# Patient Record
Sex: Female | Born: 1945 | Race: White | Hispanic: No | Smoking: Current every day smoker
Health system: Southern US, Community
[De-identification: ages and names within clinical notes are randomized; demographics above are authoritative.]

## PROBLEM LIST (undated history)

## (undated) DIAGNOSIS — R296 Repeated falls: Secondary | ICD-10-CM

## (undated) DIAGNOSIS — W19XXXA Unspecified fall, initial encounter: Secondary | ICD-10-CM

## (undated) DIAGNOSIS — L932 Other local lupus erythematosus: Secondary | ICD-10-CM

## (undated) DIAGNOSIS — M3500 Sicca syndrome, unspecified: Secondary | ICD-10-CM

## (undated) DIAGNOSIS — M3320 Polymyositis, organ involvement unspecified: Secondary | ICD-10-CM

## (undated) DIAGNOSIS — L409 Psoriasis, unspecified: Secondary | ICD-10-CM

## (undated) HISTORY — PX: OVARIAN CYST REMOVAL: SHX89

## (undated) HISTORY — PX: KNEE ARTHROSCOPY: SUR90

## (undated) HISTORY — PX: ABDOMINAL HYSTERECTOMY: SHX81

## (undated) HISTORY — PX: CHOLECYSTECTOMY: SHX55

## (undated) HISTORY — PX: SHOULDER SURGERY: SHX246

## (undated) HISTORY — PX: ABDOMINAL SURGERY: SHX537

---

## 1998-08-26 ENCOUNTER — Other Ambulatory Visit: Admission: RE | Admit: 1998-08-26 | Discharge: 1998-08-26 | Payer: Self-pay | Admitting: Obstetrics and Gynecology

## 2000-02-28 ENCOUNTER — Other Ambulatory Visit: Admission: RE | Admit: 2000-02-28 | Discharge: 2000-02-28 | Payer: Self-pay | Admitting: Obstetrics and Gynecology

## 2001-07-31 ENCOUNTER — Other Ambulatory Visit: Admission: RE | Admit: 2001-07-31 | Discharge: 2001-07-31 | Payer: Self-pay | Admitting: Obstetrics and Gynecology

## 2002-05-20 ENCOUNTER — Other Ambulatory Visit: Admission: RE | Admit: 2002-05-20 | Discharge: 2002-05-20 | Payer: Self-pay | Admitting: Obstetrics and Gynecology

## 2005-10-24 ENCOUNTER — Other Ambulatory Visit: Admission: RE | Admit: 2005-10-24 | Discharge: 2005-10-24 | Payer: Self-pay | Admitting: Obstetrics and Gynecology

## 2007-02-28 ENCOUNTER — Ambulatory Visit (HOSPITAL_COMMUNITY): Admission: RE | Admit: 2007-02-28 | Discharge: 2007-02-28 | Payer: Self-pay | Admitting: Specialist

## 2008-09-07 ENCOUNTER — Ambulatory Visit: Payer: Self-pay | Admitting: Obstetrics and Gynecology

## 2008-09-07 ENCOUNTER — Encounter: Payer: Self-pay | Admitting: Obstetrics and Gynecology

## 2008-09-07 ENCOUNTER — Other Ambulatory Visit: Admission: RE | Admit: 2008-09-07 | Discharge: 2008-09-07 | Payer: Self-pay | Admitting: Obstetrics and Gynecology

## 2008-09-20 ENCOUNTER — Ambulatory Visit: Payer: Self-pay | Admitting: Obstetrics and Gynecology

## 2009-06-05 ENCOUNTER — Observation Stay (HOSPITAL_COMMUNITY): Admission: EM | Admit: 2009-06-05 | Discharge: 2009-06-06 | Payer: Self-pay | Admitting: Emergency Medicine

## 2009-06-16 ENCOUNTER — Observation Stay (HOSPITAL_COMMUNITY): Admission: RE | Admit: 2009-06-16 | Discharge: 2009-06-18 | Payer: Self-pay | Admitting: Orthopedic Surgery

## 2009-12-21 ENCOUNTER — Ambulatory Visit: Payer: Self-pay | Admitting: Obstetrics and Gynecology

## 2009-12-21 ENCOUNTER — Other Ambulatory Visit: Admission: RE | Admit: 2009-12-21 | Discharge: 2009-12-21 | Payer: Self-pay | Admitting: Obstetrics and Gynecology

## 2010-06-11 DIAGNOSIS — M3320 Polymyositis, organ involvement unspecified: Secondary | ICD-10-CM

## 2010-06-11 HISTORY — DX: Polymyositis, organ involvement unspecified: M33.20

## 2010-08-27 LAB — URINALYSIS, ROUTINE W REFLEX MICROSCOPIC
Nitrite: NEGATIVE
Protein, ur: NEGATIVE mg/dL
Specific Gravity, Urine: 1.017 (ref 1.005–1.030)
Urobilinogen, UA: 0.2 mg/dL (ref 0.0–1.0)

## 2010-08-27 LAB — CBC
HCT: 32.5 % — ABNORMAL LOW (ref 36.0–46.0)
Hemoglobin: 11.3 g/dL — ABNORMAL LOW (ref 12.0–15.0)
MCHC: 34.9 g/dL (ref 30.0–36.0)
Platelets: 287 10*3/uL (ref 150–400)
RDW: 14.1 % (ref 11.5–15.5)

## 2010-08-27 LAB — COMPREHENSIVE METABOLIC PANEL
Albumin: 3.9 g/dL (ref 3.5–5.2)
Alkaline Phosphatase: 103 U/L (ref 39–117)
BUN: 11 mg/dL (ref 6–23)
Calcium: 9 mg/dL (ref 8.4–10.5)
Glucose, Bld: 107 mg/dL — ABNORMAL HIGH (ref 70–99)
Potassium: 4.3 mEq/L (ref 3.5–5.1)
Total Protein: 6.7 g/dL (ref 6.0–8.3)

## 2010-08-27 LAB — PROTIME-INR
INR: 1.02 (ref 0.00–1.49)
Prothrombin Time: 13.3 seconds (ref 11.6–15.2)

## 2010-08-27 LAB — APTT: aPTT: 40 seconds — ABNORMAL HIGH (ref 24–37)

## 2010-08-27 LAB — URINE MICROSCOPIC-ADD ON

## 2010-09-11 LAB — COMPREHENSIVE METABOLIC PANEL
AST: 24 U/L (ref 0–37)
Albumin: 3.5 g/dL (ref 3.5–5.2)
BUN: 11 mg/dL (ref 6–23)
Calcium: 8.8 mg/dL (ref 8.4–10.5)
Chloride: 97 mEq/L (ref 96–112)
Creatinine, Ser: 0.52 mg/dL (ref 0.4–1.2)
GFR calc Af Amer: >60 mL/min (ref >60)
GFR calc non Af Amer: >60 mL/min (ref >60)
Total Bilirubin: 0.7 mg/dL (ref 0.3–1.2)

## 2010-09-11 LAB — PROTIME-INR: INR: 0.96 (ref 0.00–1.49)

## 2010-09-11 LAB — CBC
HCT: 29.1 % — ABNORMAL LOW (ref 36.0–46.0)
MCHC: 35.2 g/dL (ref 30.0–36.0)
MCV: 91.9 fL (ref 78.0–100.0)
Platelets: 224 10*3/uL (ref 150–400)
RDW: 13.3 % (ref 11.5–15.5)
WBC: 5.9 10*3/uL (ref 4.0–10.5)

## 2010-09-11 LAB — APTT: aPTT: 39 seconds — ABNORMAL HIGH (ref 24–37)

## 2013-05-31 ENCOUNTER — Emergency Department (INDEPENDENT_AMBULATORY_CARE_PROVIDER_SITE_OTHER)
Admission: EM | Admit: 2013-05-31 | Discharge: 2013-05-31 | Disposition: A | Discharge status: Home / Self Care | Payer: Medicare Other | Source: Home / Self Care | Attending: Emergency Medicine | Admitting: Emergency Medicine

## 2013-05-31 ENCOUNTER — Encounter: Payer: Self-pay | Admitting: Emergency Medicine

## 2013-05-31 DIAGNOSIS — J0100 Acute maxillary sinusitis, unspecified: Secondary | ICD-10-CM

## 2013-05-31 DIAGNOSIS — L932 Other local lupus erythematosus: Secondary | ICD-10-CM

## 2013-05-31 DIAGNOSIS — K1370 Unspecified lesions of oral mucosa: Secondary | ICD-10-CM

## 2013-05-31 DIAGNOSIS — L930 Discoid lupus erythematosus: Secondary | ICD-10-CM

## 2013-05-31 DIAGNOSIS — K121 Other forms of stomatitis: Secondary | ICD-10-CM

## 2013-05-31 HISTORY — DX: Polymyositis, organ involvement unspecified: M33.20

## 2013-05-31 HISTORY — DX: Psoriasis, unspecified: L40.9

## 2013-05-31 HISTORY — DX: Repeated falls: R29.6

## 2013-05-31 HISTORY — DX: Other local lupus erythematosus: L93.2

## 2013-05-31 HISTORY — DX: Sjogren syndrome, unspecified: M35.00

## 2013-05-31 HISTORY — DX: Unspecified fall, initial encounter: W19.XXXA

## 2013-05-31 MED ORDER — PREDNISONE (PAK) 10 MG PO TABS: ORAL_TABLET | ORAL | Status: AC

## 2013-05-31 MED ORDER — OXYCODONE-ACETAMINOPHEN 5-325 MG PO TABS: 1.0000 | ORAL_TABLET | Freq: Four times a day (QID) | ORAL | Status: AC | PRN

## 2013-05-31 MED ORDER — METHYLPREDNISOLONE ACETATE 80 MG/ML IJ SUSP
80.0000 mg | Freq: Once | INTRAMUSCULAR | Status: AC
  Administered 2013-05-31: 80 mg via INTRAMUSCULAR

## 2013-05-31 MED ORDER — LIDOCAINE VISCOUS 2 % MT SOLN: 20.0000 mL | OROMUCOSAL | Status: AC | PRN

## 2013-05-31 MED ORDER — VALACYCLOVIR HCL 1 G PO TABS: 2000.0000 mg | ORAL_TABLET | Freq: Two times a day (BID) | ORAL | Status: AC

## 2013-05-31 MED ORDER — AZITHROMYCIN 250 MG PO TABS: ORAL_TABLET | ORAL | Status: AC

## 2013-05-31 NOTE — ED Provider Notes (Signed)
CSN: [id number]     Arrival date & time 05/31/13  1418 History   First MD Initiated Contact with Patient 05/31/13 1558     Chief Complaint  Patient presents with  . Mouth Lesions   Patient presents here or to [facility] urgent care on Sunday 05/31/13. Patient c/o mouth sores x 2 months. States she gets them frequently but usually leaves within 2 wks but this time they have been there so long. Patient is requesting pain medication.  Note that patient has quite a complex history. She brings in 2 page typed history regarding her chief complaint and past medical history. Those pages will be scanned into the chart.--We reviewed these, face-to-face, in lengthy detail at the visit today. Please see the 2 page history for further details.  Over 45 minute visit today, more than half for discussion, history taking, exam, and further discussion of treatment plan.  HPI 2 months of waxing and waning sores on mouth, buccal mucosa, and tongue. Much worse the past 2 days.--She's not sure if they started as a blister.  She tried Diflucan 200 mg by mouth stat 2 weeks ago and then a second dose one week later, and that was no help. She tried Magic mouthwash with nystatin and lidocaine, as prescribed by her rheumatologist, Dr. Carolan Clines at [facility]. This did not help significantly.  She states her mouth pain is severe, 10 out of possible 10. She is requesting a small amount of oxycodone. I questioned her about this and she states that she had one prescription for 10 oxycodone about 6 months by her rheumatologist. She states she does not otherwise take or abuse any controlled drugs.  She also has 7 days of worsening sinusitis/URI symptoms. URI HISTORY  No chills/sweats +  Low-grade Fever + Nasal congestion +  Discolored Post-nasal drainage + sinus pain/pressure +sore throat and multiple sores in mouth  No  Cough + mild hoarseness No wheezing No chest congestion No  hemoptysis No shortness of breath No pleuritic pain No chest pain  No new itchy/red eyes (but she has chronic dry eyes Sjogren's syndrome). No change in vision No earache  No nausea No vomiting No abdominal pain No diarrhea  No skin rashes +  Fatigue No new myalgias No headache   Past Medical History  Diagnosis Date  . Polymyositis 2012  . Cutaneous lupus erythematosus   . Psoriasis   . Sjoegren syndrome   . Falls    Past Surgical History  Procedure Laterality Date  . Knee arthroscopy    . Ovarian cyst removal    . Cholecystectomy     History reviewed. No pertinent family history. History  Substance Use Topics  . Smoking status: Never Smoker   . Smokeless tobacco: Never Used  . Alcohol Use: No   OB History   Grav Para Term Preterm Abortions TAB SAB Ect Mult Living                 Review of Systems  All other systems reviewed and are negative.   see also above  Allergies  Penicillins  Home Medications   Current Outpatient Rx  Name  Route  Sig  Dispense  Refill  . azithromycin (ZITHROMAX Z-PAK) 250 MG tablet      Take 2 tablets on day one, then 1 tablet daily on days 2 through 5   1 each   0   . lidocaine (XYLOCAINE) 2 % solution   Mouth/Throat   Use  as directed 20 mLs in the mouth or throat every 3 (three) hours as needed for mouth pain. Spit out after swishing in mouth   100 mL   0   . oxyCODONE-acetaminophen (PERCOCET/ROXICET) 5-325 MG per tablet   Oral   Take 1-2 tablets by mouth every 6 (six) hours as needed for severe pain.   6 tablet   0   . predniSONE (STERAPRED UNI-PAK) 10 MG tablet      Take as directed for 6 days.--Take 6 on day 1, 5 on day 2, 4 on day 3, then 3 tablets on day 4, then 2 tablets on day 5, then 1 on day 6.   21 tablet   0   . valACYclovir (VALTREX) 1000 MG tablet   Oral   Take 2 tablets (2,000 mg total) by mouth 2 (two) times daily. For 3 days. This is an antiviral for oral ulcers.   12 tablet   0    BP  128/72  Pulse 98  Temp(Src) 98.1 F (36.7 C) (Oral)  Resp 16  Ht 5\' 3"  (1.6 m)  Wt 155 lb 8 oz (70.534 kg)  BMI 27.55 kg/m2  SpO2 97% Physical Exam  Nursing note and vitals reviewed. Constitutional: She is oriented to person, place, and time. She appears well-developed and well-nourished. She is cooperative.  Non-toxic appearance. She appears ill. No distress.  She is hoarse. She is very uncomfortable from mouth pain  HENT:  Head: Normocephalic and atraumatic.  Right Ear: Tympanic membrane, external ear and ear canal normal.  Left Ear: Tympanic membrane, external ear and ear canal normal.  Nose: Mucosal edema and rhinorrhea present. Right sinus exhibits maxillary sinus tenderness. Left sinus exhibits maxillary sinus tenderness.  Mouth/Throat: Oral lesions present. No dental abscesses or uvula swelling. Posterior oropharyngeal erythema (Minimal) present. No oropharyngeal exudate, posterior oropharyngeal edema or tonsillar abscesses.  Multiple, tender, scattered, discrete, red macules on the buccal mucosa bilaterally, inside both cheeks, upper and lower lips. A few of these have tiny ulcer in the center, which are healing. No vesicles. No pustules.  Eyes: Right eye exhibits no discharge. Left eye exhibits no discharge. No scleral icterus.  Neck: Neck supple.  Cardiovascular: Normal rate, regular rhythm, S1 normal, S2 normal and normal heart sounds.   No murmur heard. Pulmonary/Chest: Effort normal and breath sounds normal. She has no decreased breath sounds. She has no wheezes. She has no rales.  Abdominal: Normal appearance. She exhibits no distension. There is no tenderness.  Lymphadenopathy:       Head (right side): Submental adenopathy present.       Head (left side): Submental adenopathy present.    She has cervical adenopathy.       Right cervical: Superficial cervical adenopathy present.       Left cervical: Superficial cervical adenopathy present.  Neurological: She is alert and  oriented to person, place, and time. No cranial nerve deficit.  Skin: Skin is warm and dry. No rash noted.  Psychiatric: She has a normal mood and affect.  Mildly anxious, but otherwise normal mood and affect and behavior    ED Course  Procedures (including critical care time) Labs Review Labs Reviewed - No data to display Imaging Review No results found.  EKG Interpretation    Date/Time:    Ventricular Rate:    PR Interval:    QRS Duration:   QT Interval:    QTC Calculation:   R Axis:     Text Interpretation:  MDM   1. Mouth ulcers   2. Cutaneous lupus erythematosus   3. Acute maxillary sinusitis    We discussed quite at length. I explained the difficulty with diagnosis because of her complex history of collagen vascular disease, including autoimmune polio myositis, Sjogren's syndrome, and autoimmune cutaneous lupus.--She states her dermatologist in the past told her that oral lesions were related to her collagen vascular disease.  However, she also has a history of cold sores in the past, so the oral lesions could possibly be a herpes simplex. She also has acute maxillary sinusitis.  She also brings in labs from Dr Carolan Clines done 05/29/13, including normal CBC, creatinine. Her CPK was 205, which is mildly elevated, but she states this is the lowest CPK she's had in the past 2 years, and that Dr. Carolan Clines told her that this is good and not alarming, and is stable on methotrexate.  Risks, benefits, alternatives discussed. She agrees with the following treatment plan: Depo-Medrol 80 mg IM stat. Prednisone 10 mg-6 day Dosepak. Z-Pak for the sinusitis, she is allergic to penicillin. Valtrex prescribed to cover herpes simplex. Viscous Xylocaine for topical pain relief.--Usage and precautions discussed. I agreed to prescribe oxycodone, #6, no refills. For acute pain relief, but precautions discussed. Advised to see an ENT within one week. Followup with PCP  and/orDr Carolan Clines within one week Precautions discussed. Red flags discussed. Questions invited and answered. Patient voiced understanding and agreement.    Lajean Manes, MD 05/31/13 [phone]

## 2013-05-31 NOTE — ED Notes (Signed)
Patient c/o mouth sores x 2 months. States she gets them frequently but usually leaves within 2 wks but this time they have been there so long. Patient is requesting pain medication.

## 2014-01-10 ENCOUNTER — Emergency Department (INDEPENDENT_AMBULATORY_CARE_PROVIDER_SITE_OTHER)
Admission: EM | Admit: 2014-01-10 | Discharge: 2014-01-10 | Disposition: A | Discharge status: Home / Self Care | Payer: Medicare Other | Source: Home / Self Care | Attending: Family Medicine | Admitting: Family Medicine

## 2014-01-10 ENCOUNTER — Encounter: Payer: Self-pay | Admitting: Emergency Medicine

## 2014-01-10 DIAGNOSIS — IMO0002 Reserved for concepts with insufficient information to code with codable children: Secondary | ICD-10-CM

## 2014-01-10 DIAGNOSIS — S80211A Abrasion, right knee, initial encounter: Secondary | ICD-10-CM

## 2014-01-10 MED ORDER — MUPIROCIN 2 % EX OINT: 1.0000 "application " | TOPICAL_OINTMENT | Freq: Three times a day (TID) | CUTANEOUS | Status: DC

## 2014-01-10 MED ORDER — MUPIROCIN 2 % EX OINT: 1.0000 "application " | TOPICAL_OINTMENT | Freq: Three times a day (TID) | CUTANEOUS | Status: AC

## 2014-01-10 NOTE — Discharge Instructions (Signed)
Keep wound covered:  Apply new sterile bandage to wound with each application of antibiotic ointment.  Elevate leg whenever possible.

## 2014-01-10 NOTE — ED Notes (Signed)
Fell and abraded right knee on 12/09/2013; when scab fell off yesterday patient noticed fluid filled vesicle in center of site. Somewhat painful to touch. Has diminished autoimmune system.

## 2014-01-10 NOTE — ED Provider Notes (Signed)
CSN: [id number]     Arrival date & time 01/10/14  1122 History   First MD Initiated Contact with Patient 01/10/14 1208     Chief Complaint  Patient presents with  . Knee Pain      HPI Comments: Patient states that she tripped on a curb one month ago, abrading her right anterior knee on concrete.  The abrasion gradually healed.  Yesterday a remaining eschar fell off, revealing a fluid filled bulla.  The area has been somewhat painful  The history is provided by the patient.    Past Medical History  Diagnosis Date  . Polymyositis 2012  . Cutaneous lupus erythematosus   . Psoriasis   . Sjoegren syndrome   . Falls    Past Surgical History  Procedure Laterality Date  . Knee arthroscopy    . Ovarian cyst removal    . Cholecystectomy     History reviewed. No pertinent family history. History  Substance Use Topics  . Smoking status: Never Smoker   . Smokeless tobacco: Never Used  . Alcohol Use: No   OB History   Grav Para Term Preterm Abortions TAB SAB Ect Mult Living                 Review of Systems  Constitutional: Negative for fever, chills and fatigue.  HENT: Negative.   Eyes: Negative.   Respiratory: Negative.   Cardiovascular: Negative.   Gastrointestinal: Negative.   Genitourinary: Negative.   Musculoskeletal: Negative.   Skin: Positive for wound.    Allergies  Penicillins  Home Medications   Prior to Admission medications   Medication Sig Start Date End Date Taking? Authorizing Provider  amitriptyline (ELAVIL) 10 MG tablet Take 10 mg by mouth at bedtime.   Yes Historical Provider, MD  hydroxychloroquine (PLAQUENIL) 200 MG tablet Take by mouth daily.   Yes Historical Provider, MD  hyoscyamine (LEVBID) 0.375 MG 12 hr tablet Take 0.375 mg by mouth 2 (two) times daily.   Yes Historical Provider, MD  methotrexate (50 MG/ML) 1 G injection Inject into the vein once.   Yes Historical Provider, MD  methotrexate 25 MG/ML injection Inject into the skin once a week.    Yes Historical Provider, MD  venlafaxine XR (EFFEXOR-XR) 150 MG 24 hr capsule Take 150 mg by mouth daily with breakfast.   Yes Historical Provider, MD  azithromycin (ZITHROMAX Z-PAK) 250 MG tablet Take 2 tablets on day one, then 1 tablet daily on days 2 through 5 05/31/13   Lajean Manesavid Massey, MD  lidocaine (XYLOCAINE) 2 % solution Use as directed 20 mLs in the mouth or throat every 3 (three) hours as needed for mouth pain. Spit out after swishing in mouth 05/31/13   Lajean Manesavid Massey, MD  mupirocin ointment (BACTROBAN) 2 % Apply 1 application topically 3 (three) times daily. 01/10/14   Lattie HawStephen A Carlee Vonderhaar, MD  oxyCODONE-acetaminophen (PERCOCET/ROXICET) 5-325 MG per tablet Take 1-2 tablets by mouth every 6 (six) hours as needed for severe pain. 05/31/13   Lajean Manesavid Massey, MD  predniSONE (STERAPRED UNI-PAK) 10 MG tablet Take as directed for 6 days.--Take 6 on day 1, 5 on day 2, 4 on day 3, then 3 tablets on day 4, then 2 tablets on day 5, then 1 on day 6. 05/31/13   Lajean Manesavid Massey, MD  valACYclovir (VALTREX) 1000 MG tablet Take 2 tablets (2,000 mg total) by mouth 2 (two) times daily. For 3 days. This is an antiviral for oral ulcers. 05/31/13   Lajean Manesavid Massey, MD  BP 107/69  Pulse 88  Temp(Src) 98.9 F (37.2 C) (Oral)  Resp 16  Ht 5\' 3"  (1.6 m)  Wt 168 lb (76.204 kg)  BMI 29.77 kg/m2  SpO2 97% Physical Exam  Nursing note and vitals reviewed. Constitutional: She is oriented to person, place, and time. She appears well-developed and well-nourished.  HENT:  Head: Atraumatic.  Eyes: Conjunctivae are normal. Pupils are equal, round, and reactive to light.  Musculoskeletal:       Legs: Patient's right lower leg, just beneath patella, has a well healed abrasion about 2cm by 7cm, and no evidence cellulitis.  The area is not tender to palpation.  At the medial aspect of healed abrasion is a 1.5cm diameter bulla filled with clear liquid.  Neurological: She is alert and oriented to person, place, and time.  Skin: Skin is  dry.    ED Course  Procedures  none      MDM   1. Abrasion of right knee (healed), initial encounter; does not appear infected    As a precaution will begin Bactroban ointment.  Advised not to rupture the bulla Keep wound covered:  Apply new sterile bandage to wound with each application of antibiotic ointment.  Elevate leg whenever possible. Followup with Family Doctor if not improved in about 3 to 4 days.    Lattie Haw, MD 01/13/14 1255

## 2014-10-10 DEATH — deceased

## 2018-01-03 ENCOUNTER — Emergency Department (HOSPITAL_BASED_OUTPATIENT_CLINIC_OR_DEPARTMENT_OTHER): Payer: Medicare HMO

## 2018-01-03 ENCOUNTER — Encounter (HOSPITAL_BASED_OUTPATIENT_CLINIC_OR_DEPARTMENT_OTHER): Payer: Self-pay | Admitting: Emergency Medicine

## 2018-01-03 ENCOUNTER — Emergency Department (HOSPITAL_BASED_OUTPATIENT_CLINIC_OR_DEPARTMENT_OTHER)
Admission: EM | Admit: 2018-01-03 | Discharge: 2018-01-03 | Disposition: A | Discharge status: Home / Self Care | Payer: Medicare HMO | Attending: Emergency Medicine | Admitting: Emergency Medicine

## 2018-01-03 ENCOUNTER — Other Ambulatory Visit: Payer: Self-pay

## 2018-01-03 DIAGNOSIS — Z79899 Other long term (current) drug therapy: Secondary | ICD-10-CM | POA: Insufficient documentation

## 2018-01-03 DIAGNOSIS — W19XXXA Unspecified fall, initial encounter: Secondary | ICD-10-CM

## 2018-01-03 DIAGNOSIS — M25461 Effusion, right knee: Secondary | ICD-10-CM | POA: Diagnosis not present

## 2018-01-03 DIAGNOSIS — M7989 Other specified soft tissue disorders: Secondary | ICD-10-CM

## 2018-01-03 DIAGNOSIS — M25561 Pain in right knee: Secondary | ICD-10-CM | POA: Insufficient documentation

## 2018-01-03 NOTE — ED Triage Notes (Signed)
R knee pain after falling 1 week ago.

## 2018-01-03 NOTE — ED Provider Notes (Signed)
[facility] EMERGENCY DEPARTMENT Provider Note   CSN: [id number] Arrival date & time: 01/03/18  1532     History   Chief Complaint Chief Complaint  Patient presents with  . Knee Pain    HPI Linda Alvarez is a 72 y.o. female with h/o lupus, polymyositis, Sjogren's here for evaluation of right knee pain onset 1 week ago after fall. States she was trying to flush a toilet with her left leg and lost balance, falling onto her right side.  Her right leg bent at the knee backwards underneath her when she fell. Initially had swelling from knee to ankle but this has significantly improved however the pain has actually increased.  Pain is mostly medially and in popliteal space.  Has felt locking/giving out sensation in the knee during walking. She still has swelling to ankle but decreased. She has also noticed a "knot" to the outside lateral thigh that is mildly tender to touch, this has decreased in size as well. Took tylenol twice which did not help.  No previous h/o of injury or surgery in the past to the joint. Recent 6 hour car trip to visit family 1 week ago before symptoms started. H/o superficial blood clot after IV but no h/o DVT/PE. No leg redness, warmth, fevers, chills, loss of sensation or weakness, CP, SOB, cough,.   HPI  Past Medical History:  Diagnosis Date  . Cutaneous lupus erythematosus   . Falls   . Polymyositis (HCC) 2012  . Psoriasis   . Sjoegren syndrome     There are no active problems to display for this patient.   Past Surgical History:  Procedure Laterality Date  . CHOLECYSTECTOMY    . KNEE ARTHROSCOPY    . OVARIAN CYST REMOVAL       OB History   None      Home Medications    Prior to Admission medications   Medication Sig Start Date End Date Taking? Authorizing Provider  amitriptyline (ELAVIL) 10 MG tablet Take 10 mg by mouth at bedtime.    [provider]  azithromycin (ZITHROMAX Z-PAK) 250 MG tablet Take 2 tablets on day one,  then 1 tablet daily on days 2 through 5 05/31/13   Lajean Manes, MD  hydroxychloroquine (PLAQUENIL) 200 MG tablet Take by mouth daily.    [provider]  hyoscyamine (LEVBID) 0.375 MG 12 hr tablet Take 0.375 mg by mouth 2 (two) times daily.    [provider]  lidocaine (XYLOCAINE) 2 % solution Use as directed 20 mLs in the mouth or throat every 3 (three) hours as needed for mouth pain. Spit out after swishing in mouth 05/31/13   Lajean Manes, MD  methotrexate (50 MG/ML) 1 G injection Inject into the vein once.    [provider]  methotrexate 25 MG/ML injection Inject into the skin once a week.    [provider]  mupirocin ointment (BACTROBAN) 2 % Apply 1 application topically 3 (three) times daily. 01/10/14   Lattie Haw, MD  oxyCODONE-acetaminophen (PERCOCET/ROXICET) 5-325 MG per tablet Take 1-2 tablets by mouth every 6 (six) hours as needed for severe pain. 05/31/13   Lajean Manes, MD  predniSONE (STERAPRED UNI-PAK) 10 MG tablet Take as directed for 6 days.--Take 6 on day 1, 5 on day 2, 4 on day 3, then 3 tablets on day 4, then 2 tablets on day 5, then 1 on day 6. 05/31/13   Lajean Manes, MD  valACYclovir (VALTREX) 1000 MG tablet  Take 2 tablets (2,000 mg total) by mouth 2 (two) times daily. For 3 days. This is an antiviral for oral ulcers. 05/31/13   Lajean Manes, MD  venlafaxine XR (EFFEXOR-XR) 150 MG 24 hr capsule Take 150 mg by mouth daily with breakfast.    [provider]    Family History No family history on file.  Social History Social History   Tobacco Use  . Smoking status: Never Smoker  . Smokeless tobacco: Never Used  Substance Use Topics  . Alcohol use: No  . Drug use: No     Allergies   Penicillins   Review of Systems Review of Systems  Musculoskeletal: Positive for arthralgias, gait problem and joint swelling.  Allergic/Immunologic: Positive for immunocompromised state.     Physical Exam Updated Vital  Signs BP (!) 168/60 (BP Location: Right Arm)   Pulse 100   Temp 98.4 F (36.9 C) (Oral)   Resp 18   Ht 5\' 3"  (1.6 m)   Wt 74.8 kg (165 lb)   SpO2 100%   BMI 29.23 kg/m   Physical Exam  Constitutional: She is oriented to person, place, and time. She appears well-developed and well-nourished.  Non-toxic appearance.  HENT:  Head: Normocephalic.  Right Ear: External ear normal.  Left Ear: External ear normal.  Nose: Nose normal.  Eyes: Conjunctivae and EOM are normal.  Neck: Full passive range of motion without pain.  Cardiovascular: Normal rate.  2+ DP, PT and popliteal pulses bilaterally. Trace pitting from right ankle to below knee. Tenderness to popliteal space, no significant edema or induration. No calf tenderness. No vericose veins or palpable cords up lower legs or proximal/medial thighs.  Pulmonary/Chest: Effort normal. No tachypnea. No respiratory distress.  Musculoskeletal: Normal range of motion. She exhibits edema and tenderness.  Right knee: mild anterior knee edema.  TTP to medial joint line, MCL and popliteal space.  Full passive ROM with pain with full flexion/extension.  Normal patellar J tracking. No lateral joint line tenderness.  No bony tenderness over patella, fibular head or tibial tuberosity.  No tenderness over LCL, patellar tendon or quadriceps tendon.   Negative Lachman's. Negative posterior drawer test.  Positive medial McMurray's. No varus or valgus laxity. No crepitus with knee ROM.   Neurological: She is alert and oriented to person, place, and time.  5/5 strength with ankle and knee F/E bilaterally. Sensation to light touch intact in lower extremities.  Skin: Skin is warm and dry. Capillary refill takes less than 2 seconds.  No erythema, warmth or fluctuance to right knee  Psychiatric: Her behavior is normal. Thought content normal.     ED Treatments / Results  Labs (all labs ordered are listed, but only abnormal results are displayed) Labs Reviewed -  No data to display  EKG None  Radiology US Venous Img Lower Right (dvt Study)  Result Date: 01/03/2018 CLINICAL DATA:  Right lower extremity pain and swelling after fall last week. EXAM: Right LOWER EXTREMITY VENOUS DOPPLER ULTRASOUND TECHNIQUE: Gray-scale sonography with graded compression, as well as color Doppler and duplex ultrasound were performed to evaluate the lower extremity deep venous systems from the level of the common femoral vein and including the common femoral, femoral, profunda femoral, popliteal and calf veins including the posterior tibial, peroneal and gastrocnemius veins when visible. The superficial great saphenous vein was also interrogated. Spectral Doppler was utilized to evaluate flow at rest and with distal augmentation maneuvers in the common femoral, femoral and popliteal veins. COMPARISON:  None.  FINDINGS: Contralateral Common Femoral Vein: Respiratory phasicity is normal and symmetric with the symptomatic side. No evidence of thrombus. Normal compressibility. Common Femoral Vein: No evidence of thrombus. Normal compressibility, respiratory phasicity and response to augmentation. Saphenofemoral Junction: No evidence of thrombus. Normal compressibility and flow on color Doppler imaging. Profunda Femoral Vein: No evidence of thrombus. Normal compressibility and flow on color Doppler imaging. Femoral Vein: No evidence of thrombus. Normal compressibility, respiratory phasicity and response to augmentation. Popliteal Vein: No evidence of thrombus. Normal compressibility, respiratory phasicity and response to augmentation. Calf Veins: No evidence of thrombus. Normal compressibility and flow on color Doppler imaging. Superficial Great Saphenous Vein: No evidence of thrombus. Normal compressibility. Venous Reflux:  None. Other Findings:  None. IMPRESSION: No evidence of deep venous thrombosis seen in right lower extremity. Electronically Signed   By: Lupita RaiderJames  Green Jr, M.D.   On:  01/03/2018 17:54   Dg Knee Complete 4 Views Right  Result Date: 01/03/2018 CLINICAL DATA:  Fall 1 week ago with persistent knee pain, initial encounter EXAM: RIGHT KNEE - COMPLETE 4+ VIEW COMPARISON:  None. FINDINGS: No acute fracture or dislocation is noted. No joint effusion is seen. Mild patellofemoral degenerative changes are noted. IMPRESSION: Mild degenerative change without acute abnormality. Electronically Signed   By: Alcide CleverMark  Lukens M.D.   On: 01/03/2018 15:57    Procedures Procedures (including critical care time)  Medications Ordered in ED Medications - No data to display   Initial Impression / Assessment and Plan / ED Course  I have reviewed the triage vital signs and the nursing notes.  Pertinent labs & imaging results that were available during my care of the patient were reviewed by me and considered in my medical decision making (see chart for details).     72 yo F here with traumatic right knee pain.    On exam she has TTP to medial joint line, MCL, popliteal space and asymmetric pitting edema to RLE.  Extremity NVI. Recent prolonged car travel.  Considering bony injury vs ligamentous/meniscal injury vs subluxation vs ruptured baker's cyst vs hemarthrosis.  Also consider DVT although swelling may be dependent from knee. No signs of septic arthritis or gout on exam.  She is on immunosuppressants for lupus/polymyositis.   X-ray negative. R LE US negative for DVT.  Likely intraarticular soft tissue injury medially. Will dc with sleeve, RICE, and f/u with orthopedics in 1 week for persistent symptoms.  Discussed return precautions. Pt shard with Dr Anitra LauthPlunkett.  Final Clinical Impressions(s) / ED Diagnoses   Final diagnoses:  Medial knee pain, right  Fall, initial encounter  Right leg swelling    ED Discharge Orders    None       Jerrell MylarGibbons, Laneka Mcgrory J, PA-C 01/03/18 2143    Gwyneth SproutPlunkett, Whitney, MD 01/03/18 [phone]

## 2018-01-03 NOTE — Discharge Instructions (Signed)
You were seen in the ER for knee pain after a fall with swelling.   Ultrasound was negative for a blood clot.   X-ray was negative for new bony injury or dislocation.  Injury may be to soft tissues such as ligaments, meniscus or bursae.  Rest, ice, elevate and wear knee brace to help with compression.  Take acetaminophen 500 mg every 8 hours for the next 5 days to help with inflammation.   Follow up with orthopedics in 1 week if symptoms do not improve.   Return to the ER for worsening swelling, redness, warmth, fevers, chills.

## 2018-12-13 IMAGING — CR DG KNEE COMPLETE 4+V*R*
4 series · 4 of 4 positions shown · non-contrast
Comparison: None.

CLINICAL DATA: Fall 1 week ago with persistent knee pain, initial
encounter

EXAM:
RIGHT KNEE - COMPLETE 4+ VIEW

[t knee ap right]
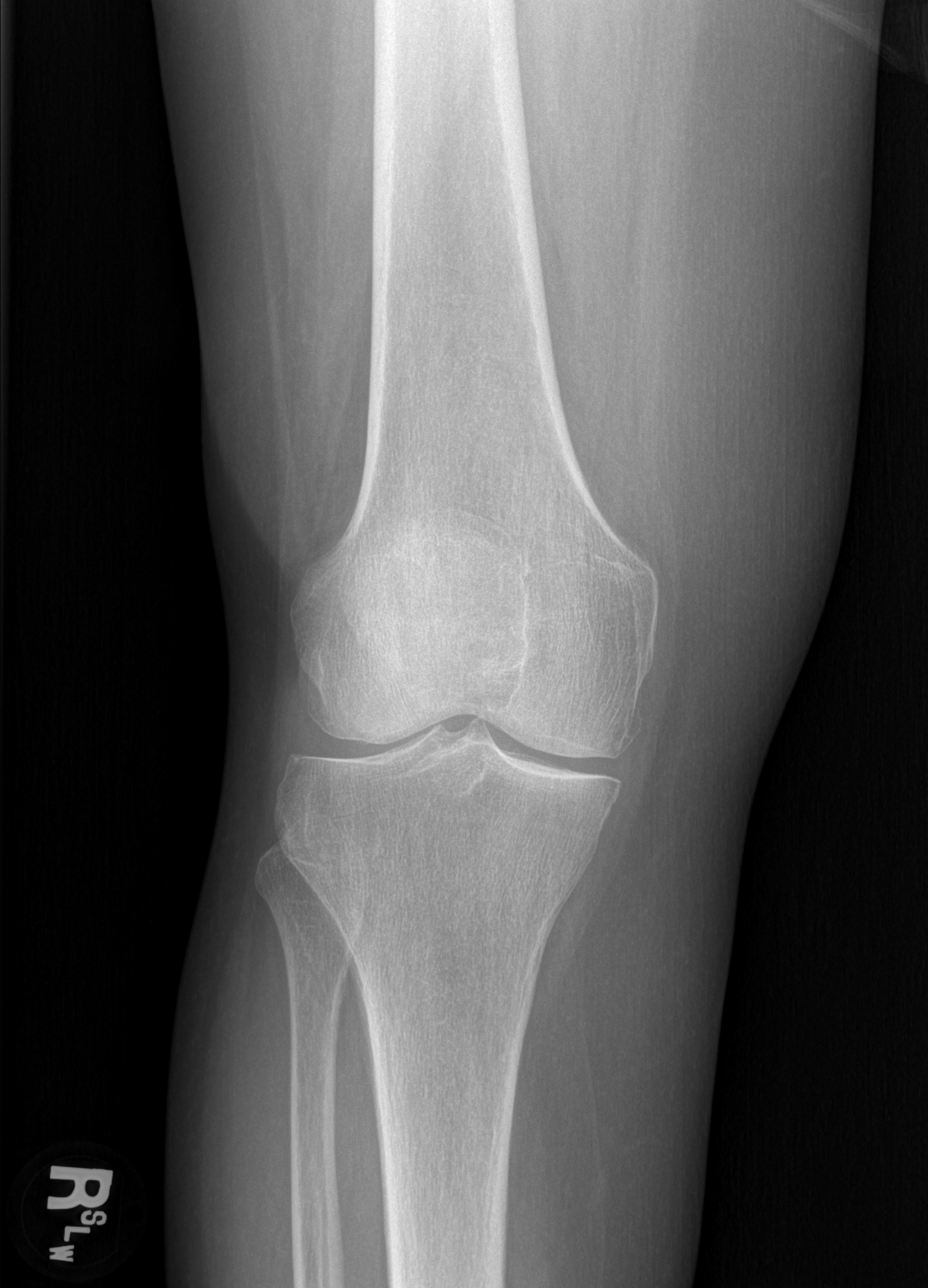

[t knee oblique right (1 of 2)]
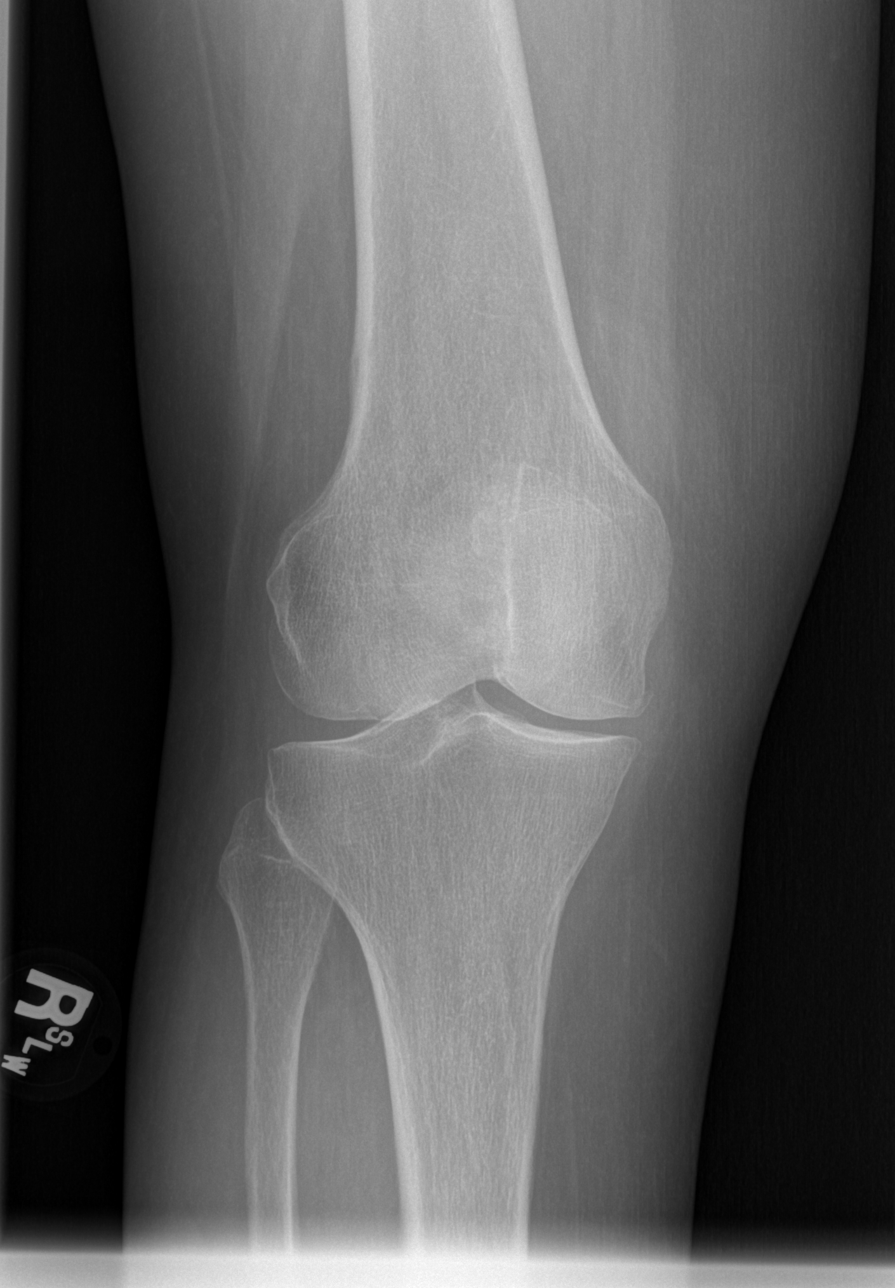

[t knee oblique right (2 of 2)]
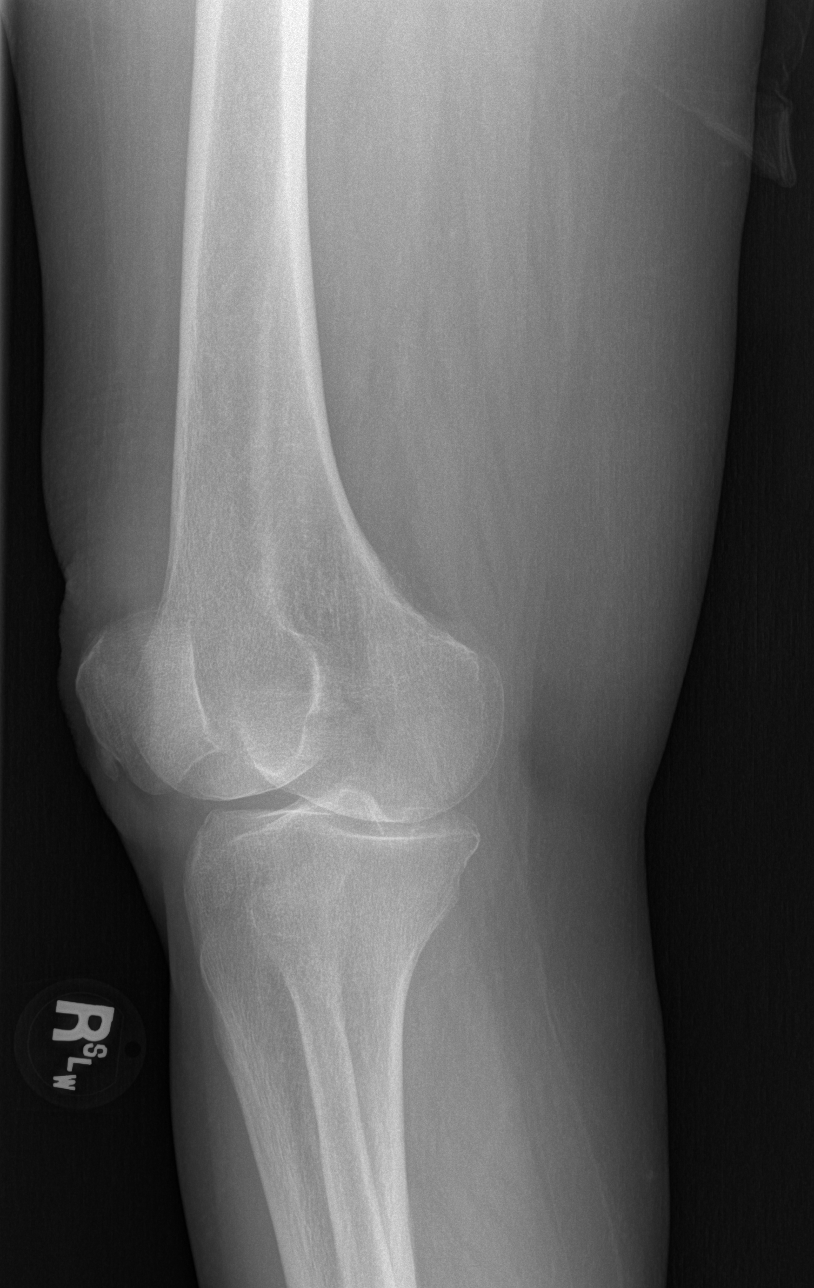

[t knee lat right]
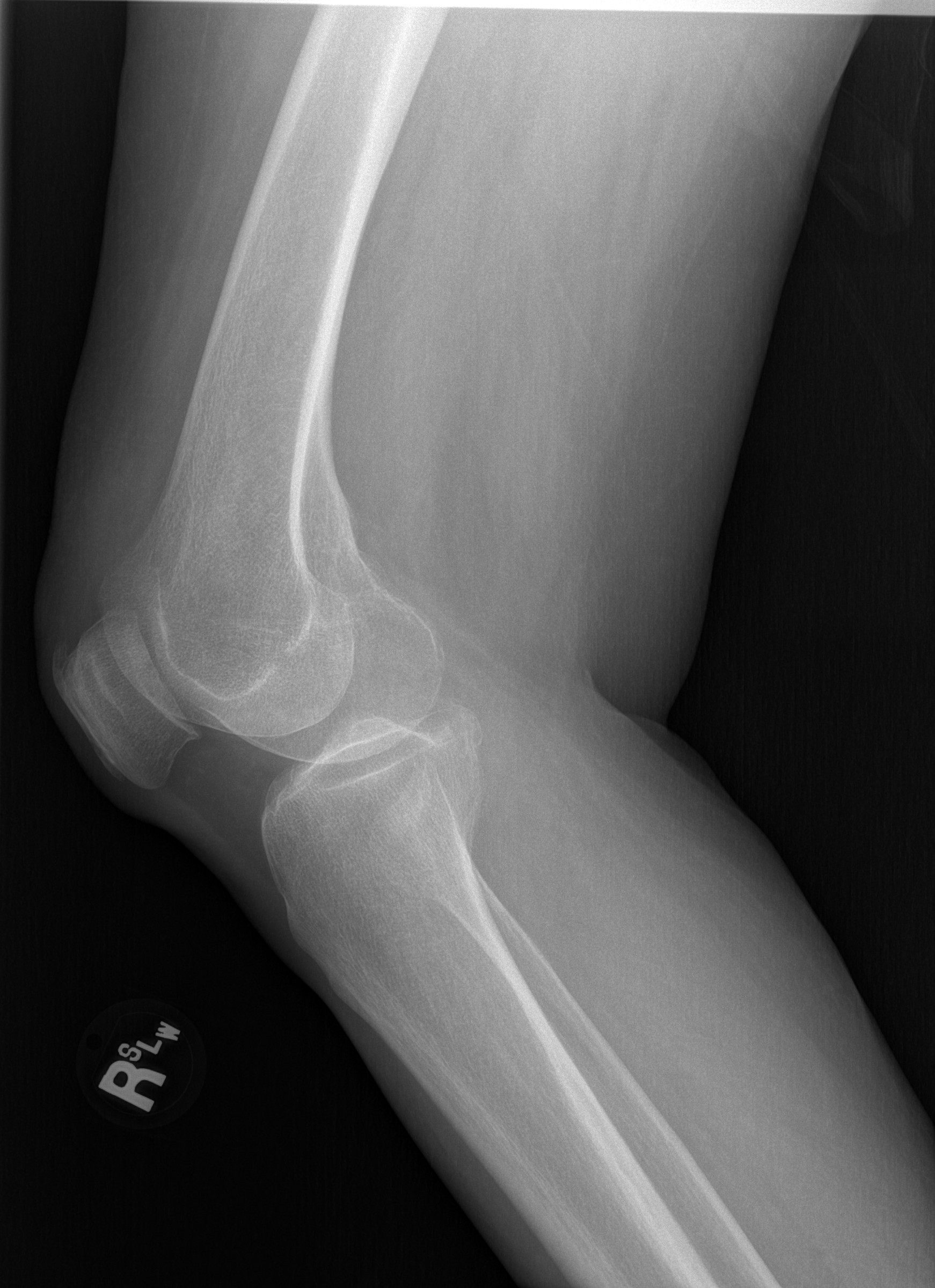

[4 of 4 positions shown; findings below may reference images not displayed]

FINDINGS: No acute fracture or dislocation is noted. No joint effusion is
seen. Mild patellofemoral degenerative changes are noted.
IMPRESSION: Mild degenerative change without acute abnormality.

## 2020-01-23 ENCOUNTER — Emergency Department (HOSPITAL_BASED_OUTPATIENT_CLINIC_OR_DEPARTMENT_OTHER): Payer: Medicare HMO

## 2020-01-23 ENCOUNTER — Emergency Department (HOSPITAL_BASED_OUTPATIENT_CLINIC_OR_DEPARTMENT_OTHER)
Admission: EM | Admit: 2020-01-23 | Discharge: 2020-01-23 | Disposition: A | Discharge status: Home / Self Care | Payer: Medicare HMO | Attending: Emergency Medicine | Admitting: Emergency Medicine

## 2020-01-23 ENCOUNTER — Other Ambulatory Visit: Payer: Self-pay

## 2020-01-23 ENCOUNTER — Encounter (HOSPITAL_BASED_OUTPATIENT_CLINIC_OR_DEPARTMENT_OTHER): Payer: Self-pay

## 2020-01-23 DIAGNOSIS — W19XXXA Unspecified fall, initial encounter: Secondary | ICD-10-CM | POA: Diagnosis not present

## 2020-01-23 DIAGNOSIS — Y9289 Other specified places as the place of occurrence of the external cause: Secondary | ICD-10-CM | POA: Diagnosis not present

## 2020-01-23 DIAGNOSIS — Y999 Unspecified external cause status: Secondary | ICD-10-CM | POA: Insufficient documentation

## 2020-01-23 DIAGNOSIS — Y9389 Activity, other specified: Secondary | ICD-10-CM | POA: Insufficient documentation

## 2020-01-23 DIAGNOSIS — M3329 Polymyositis with other organ involvement: Secondary | ICD-10-CM | POA: Diagnosis not present

## 2020-01-23 DIAGNOSIS — F17200 Nicotine dependence, unspecified, uncomplicated: Secondary | ICD-10-CM | POA: Diagnosis not present

## 2020-01-23 DIAGNOSIS — S62512A Displaced fracture of proximal phalanx of left thumb, initial encounter for closed fracture: Secondary | ICD-10-CM | POA: Diagnosis not present

## 2020-01-23 MED ORDER — ACETAMINOPHEN 325 MG PO TABS
650.0000 mg | ORAL_TABLET | Freq: Once | ORAL | Status: AC
  Administered 2020-01-23: 650 mg via ORAL
  Filled 2020-01-23: qty 2

## 2020-01-23 NOTE — ED Notes (Signed)
ED Provider at [place], [place]

## 2020-01-23 NOTE — Discharge Instructions (Signed)
Keep your splint on at all times.  Elevate your hand as much as possible to help with swelling. Apply ice for 20 minutes at a time. You can continue taking Tylenol every 4-6 hours as needed for pain. Call Dr. Debby Bud office on Monday to schedule follow-up appointment.

## 2020-01-23 NOTE — ED Provider Notes (Signed)
[facility] EMERGENCY DEPARTMENT Provider Note   CSN: [id number] Arrival date & time: 01/23/20  1454     History Chief Complaint  Patient presents with  . Fall    Linda Alvarez is a 74 y.o. female with past medical history of polymyositis, presenting to the emergency department with left thumb pain and bruising after a fall yesterday evening.  She states she does not have good mobility in her legs secondary to her polymyositis.  She bent over to pick something up and lost her balance falling backward onto her bottom.  She caught herself with her arms though her left arm jammed into the ground.  She denies any back injury or other complaints.  Not on anticoagulation.  Left thumb pain reported, worse with movement.  Denies numbness.  Denies wounds.  She is right-hand dominant.  Tylenol provided adequate relief of pain.    The history is provided by the patient.       Past Medical History:  Diagnosis Date  . Cutaneous lupus erythematosus   . Falls   . Polymyositis (HCC) 2012  . Psoriasis   . Sjoegren syndrome     There are no problems to display for this patient.   Past Surgical History:  Procedure Laterality Date  . ABDOMINAL HYSTERECTOMY    . ABDOMINAL SURGERY    . CHOLECYSTECTOMY    . KNEE ARTHROSCOPY    . OVARIAN CYST REMOVAL    . SHOULDER SURGERY       OB History   No obstetric history on file.     No family history on file.  Social History   Tobacco Use  . Smoking status: Current Every Day Smoker  . Smokeless tobacco: Never Used  Vaping Use  . Vaping Use: Never used  Substance Use Topics  . Alcohol use: No  . Drug use: No    Home Medications Prior to Admission medications   Medication Sig Start Date End Date Taking? Authorizing Provider  amitriptyline (ELAVIL) 10 MG tablet Take 10 mg by mouth at bedtime.    [provider]  azithromycin (ZITHROMAX Z-PAK) 250 MG tablet Take 2 tablets on day one, then 1 tablet daily on days 2  through 5 05/31/13   Lajean Manes, MD  hydroxychloroquine (PLAQUENIL) 200 MG tablet Take by mouth daily.    [provider]  hyoscyamine (LEVBID) 0.375 MG 12 hr tablet Take 0.375 mg by mouth 2 (two) times daily.    [provider]  lidocaine (XYLOCAINE) 2 % solution Use as directed 20 mLs in the mouth or throat every 3 (three) hours as needed for mouth pain. Spit out after swishing in mouth 05/31/13   Lajean Manes, MD  methotrexate (50 MG/ML) 1 G injection Inject into the vein once.    [provider]  methotrexate 25 MG/ML injection Inject into the skin once a week.    [provider]  mupirocin ointment (BACTROBAN) 2 % Apply 1 application topically 3 (three) times daily. 01/10/14   Lattie Haw, MD  oxyCODONE-acetaminophen (PERCOCET/ROXICET) 5-325 MG per tablet Take 1-2 tablets by mouth every 6 (six) hours as needed for severe pain. 05/31/13   Lajean Manes, MD  predniSONE (STERAPRED UNI-PAK) 10 MG tablet Take as directed for 6 days.--Take 6 on day 1, 5 on day 2, 4 on day 3, then 3 tablets on day 4, then 2 tablets on day 5, then 1 on day 6. 05/31/13   Lajean Manes, MD  valACYclovir Ralph Dowdy)  1000 MG tablet Take 2 tablets (2,000 mg total) by mouth 2 (two) times daily. For 3 days. This is an antiviral for oral ulcers. 05/31/13   Lajean Manes, MD  venlafaxine XR (EFFEXOR-XR) 150 MG 24 hr capsule Take 150 mg by mouth daily with breakfast.    [provider]    Allergies    Lanolin and Penicillins  Review of Systems   Review of Systems  Musculoskeletal: Positive for arthralgias.  Neurological: Negative for numbness.  Hematological: Does not bruise/bleed easily.    Physical Exam Updated Vital Signs BP 133/80 (BP Location: Right Arm)   Pulse 85   Temp 98.1 F (36.7 C) (Oral)   Resp 18   Ht 5\' 3"  (1.6 m)   Wt 73.9 kg   SpO2 100%   BMI 28.87 kg/m   Physical Exam Vitals and nursing note reviewed.  Constitutional:      General: She is  not in acute distress.    Appearance: She is well-developed. She is not ill-appearing.  HENT:     Head: Normocephalic and atraumatic.  Eyes:     Conjunctiva/sclera: Conjunctivae normal.  Cardiovascular:     Rate and Rhythm: Normal rate.  Pulmonary:     Effort: Pulmonary effort is normal.  Musculoskeletal:     Comments: Left thumb and hand without deformity.  There is some swelling present and bruising about the base of the left thumb.  Tenderness to the metacarpal phalangeal joint of the thumb and pain with range of motion.  Normal sensation to the tip of the thumb.  No anatomical snuffbox tenderness, normal range of motion of the wrist without pain.  Neurological:     Mental Status: She is alert.  Psychiatric:        Mood and Affect: Mood normal.        Behavior: Behavior normal.     ED Results / Procedures / Treatments   Labs (all labs ordered are listed, but only abnormal results are displayed) Labs Reviewed - No data to display  EKG None  Radiology DG Hand Complete Left  Result Date: 01/23/2020 CLINICAL DATA:  Injury with pain and swelling to base of thumb. EXAM: LEFT HAND - COMPLETE 3+ VIEW COMPARISON:  None. FINDINGS: There is diffuse decreased bone mineralization. Degenerative changes over the radiocarpal joint and carpal bones as well as significant degenerative change of the first carpometacarpal joint. Mild degenerate change of the interphalangeal joints. There is a displaced fracture involving the base of the first proximal phalanx likely with extension to the articular surface. Remainder of the exam is unremarkable. IMPRESSION: Displaced fracture involving the base of the first proximal phalanx likely with extension to the articular surface. Electronically Signed   By: 01/25/2020 M.D.   On: 01/23/2020 17:45    Procedures Procedures (including critical care time)  Medications Ordered in ED Medications  acetaminophen (TYLENOL) tablet 650 mg (650 mg Oral Given  01/23/20 1808)    ED Course  I have reviewed the triage vital signs and the nursing notes.  Pertinent labs & imaging results that were available during my care of the patient were reviewed by me and considered in my medical decision making (see chart for details).    MDM Rules/Calculators/A&P                          Patient with acute closed displaced fracture of the proximal left first phalanx after fall last night.  Patient jammed  her thumb while catching herself, no other injuries reported.  She is right-hand dominant.  Given morphology of fracture, consulted with Dr. Izora Ribas, hand surgeon, who agrees with care plan for thumb spica for immobilization, symptomatic management and outpatient follow-up.  Patient's pain is adequately managed with Tylenol, encouraged she continue taking for pain, ice, elevation, immobilization.  Patient is agreeable to plan, safe for discharge.   Final Clinical Impression(s) / ED Diagnoses Final diagnoses:  Displaced fracture of proximal phalanx of left thumb, initial encounter for closed fracture    Rx / DC Orders ED Discharge Orders    None       Shikita Vaillancourt, [place] N, PA-C 01/23/20 1853    Pollyann Savoy, MD 01/23/20 2227

## 2020-01-23 NOTE — ED Triage Notes (Signed)
Pt presents with L thumb injury after a fall last night. Pt states she bent over to pick up something and lost her balance, falling on her buttock. Denies head injury or LOC.

## 2021-01-01 IMAGING — CR DG HAND COMPLETE 3+V*L*
3 series · 3 of 3 positions shown · non-contrast
Comparison: None.

CLINICAL DATA: Injury with pain and swelling to base of thumb.

EXAM:
LEFT HAND - COMPLETE 3+ VIEW

[x hand pa left]
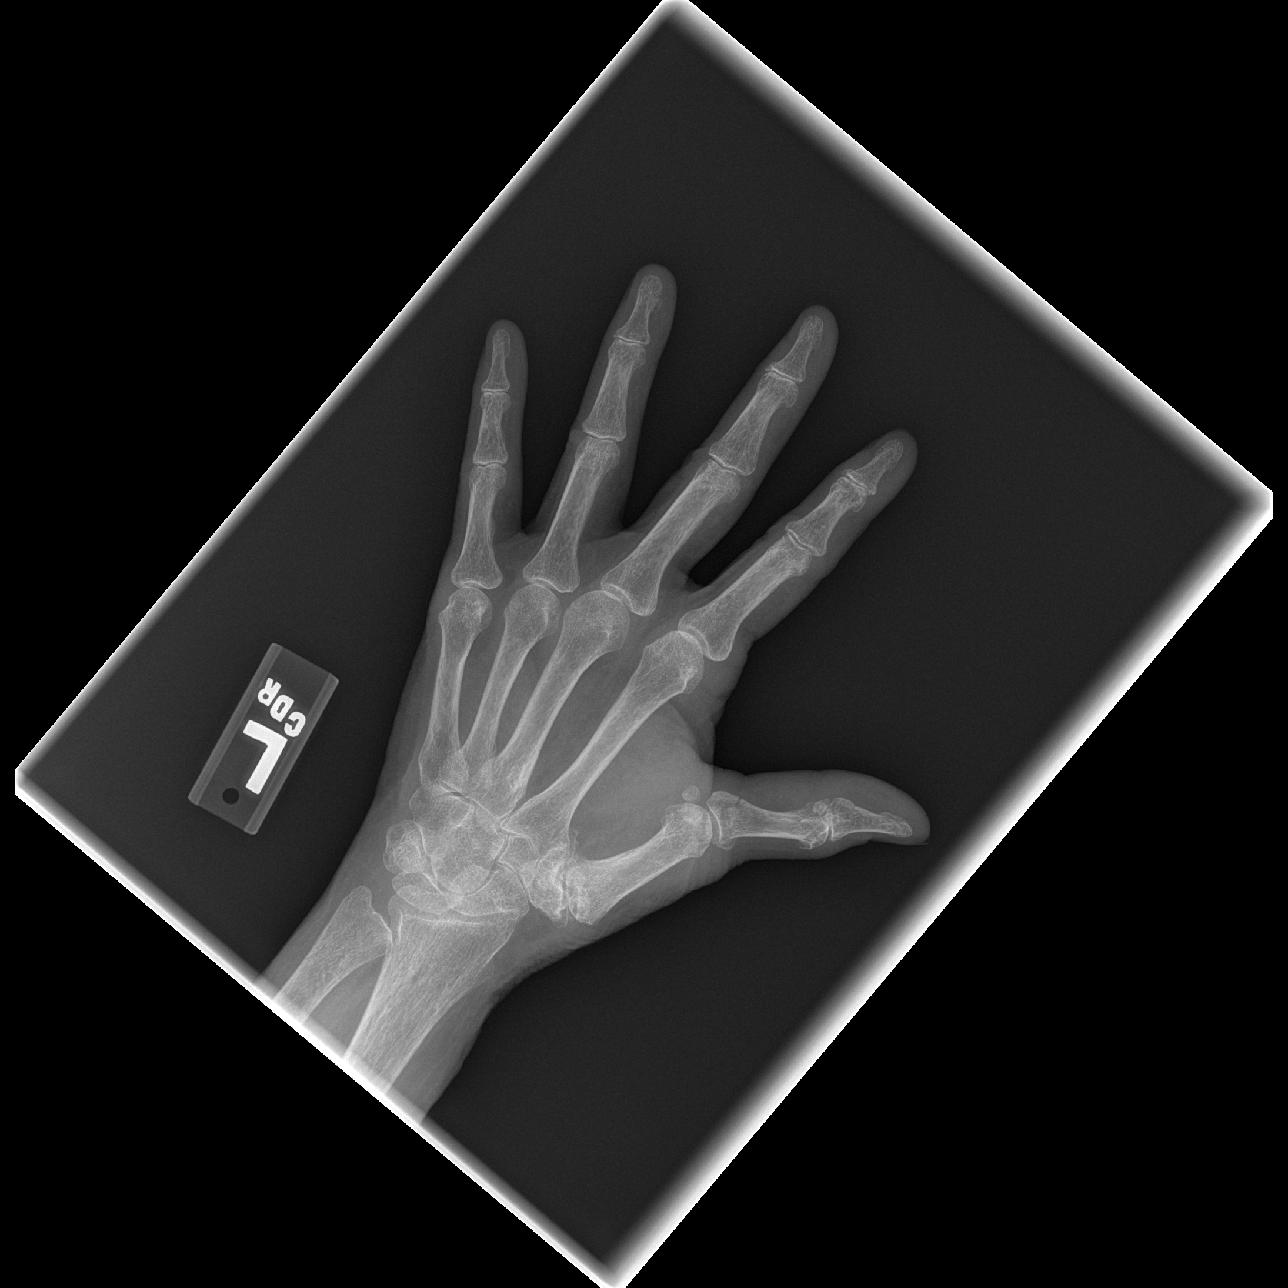

[x hand oblique left]
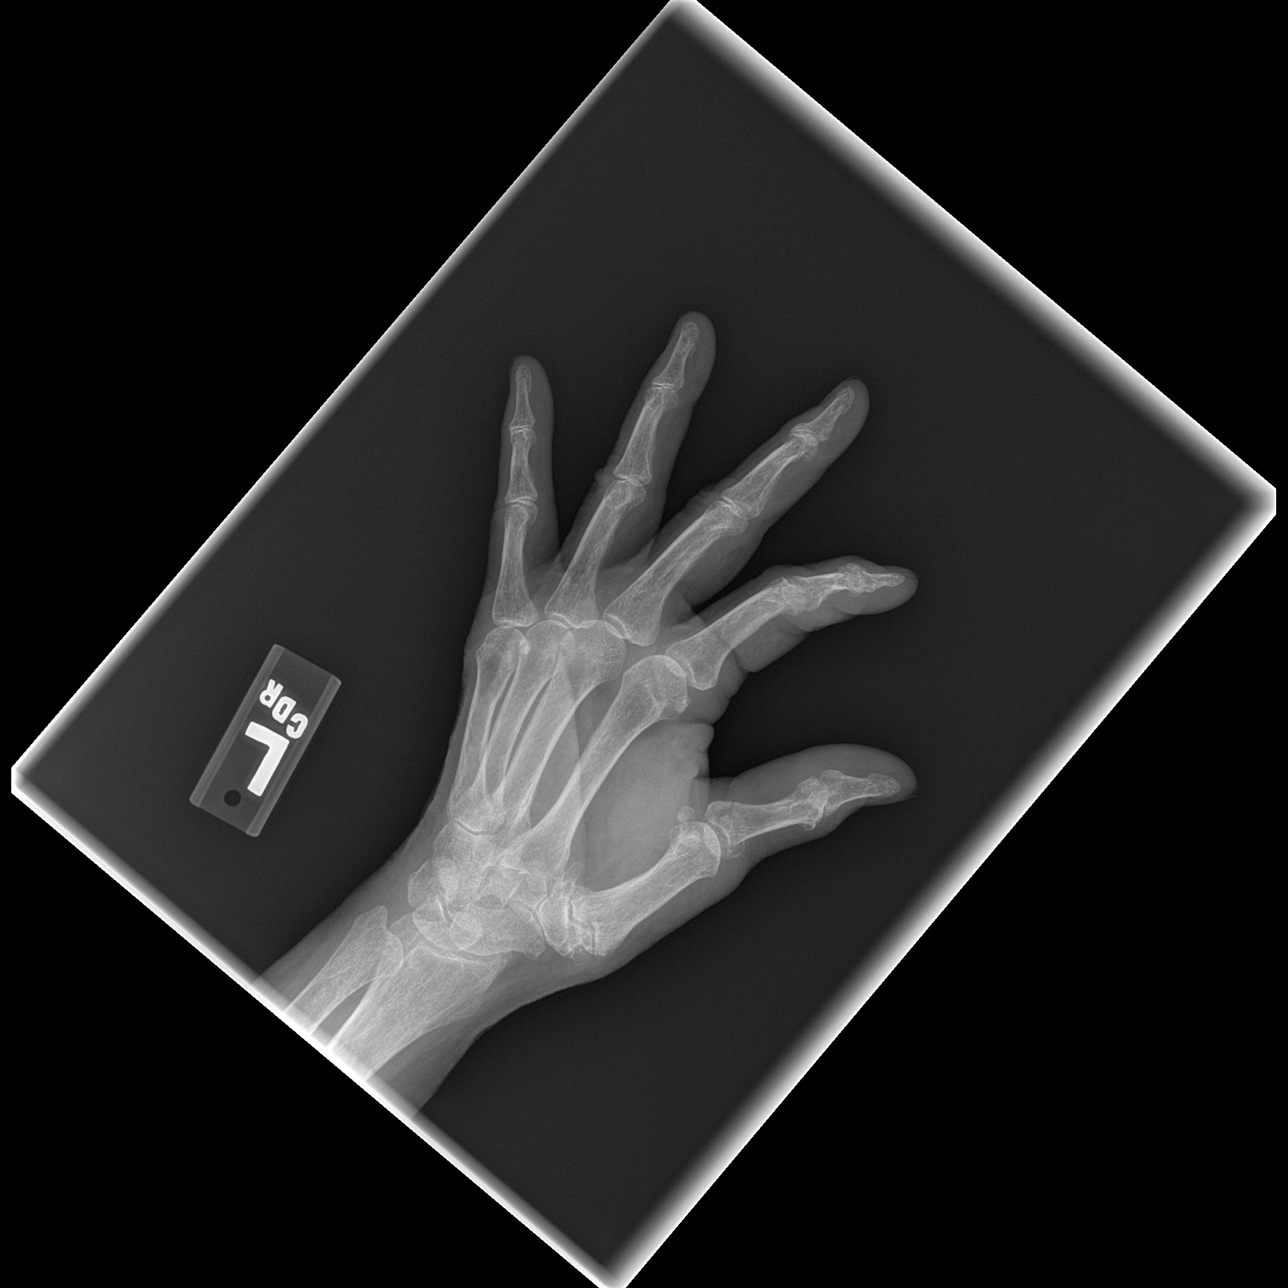

[x hand lat left]
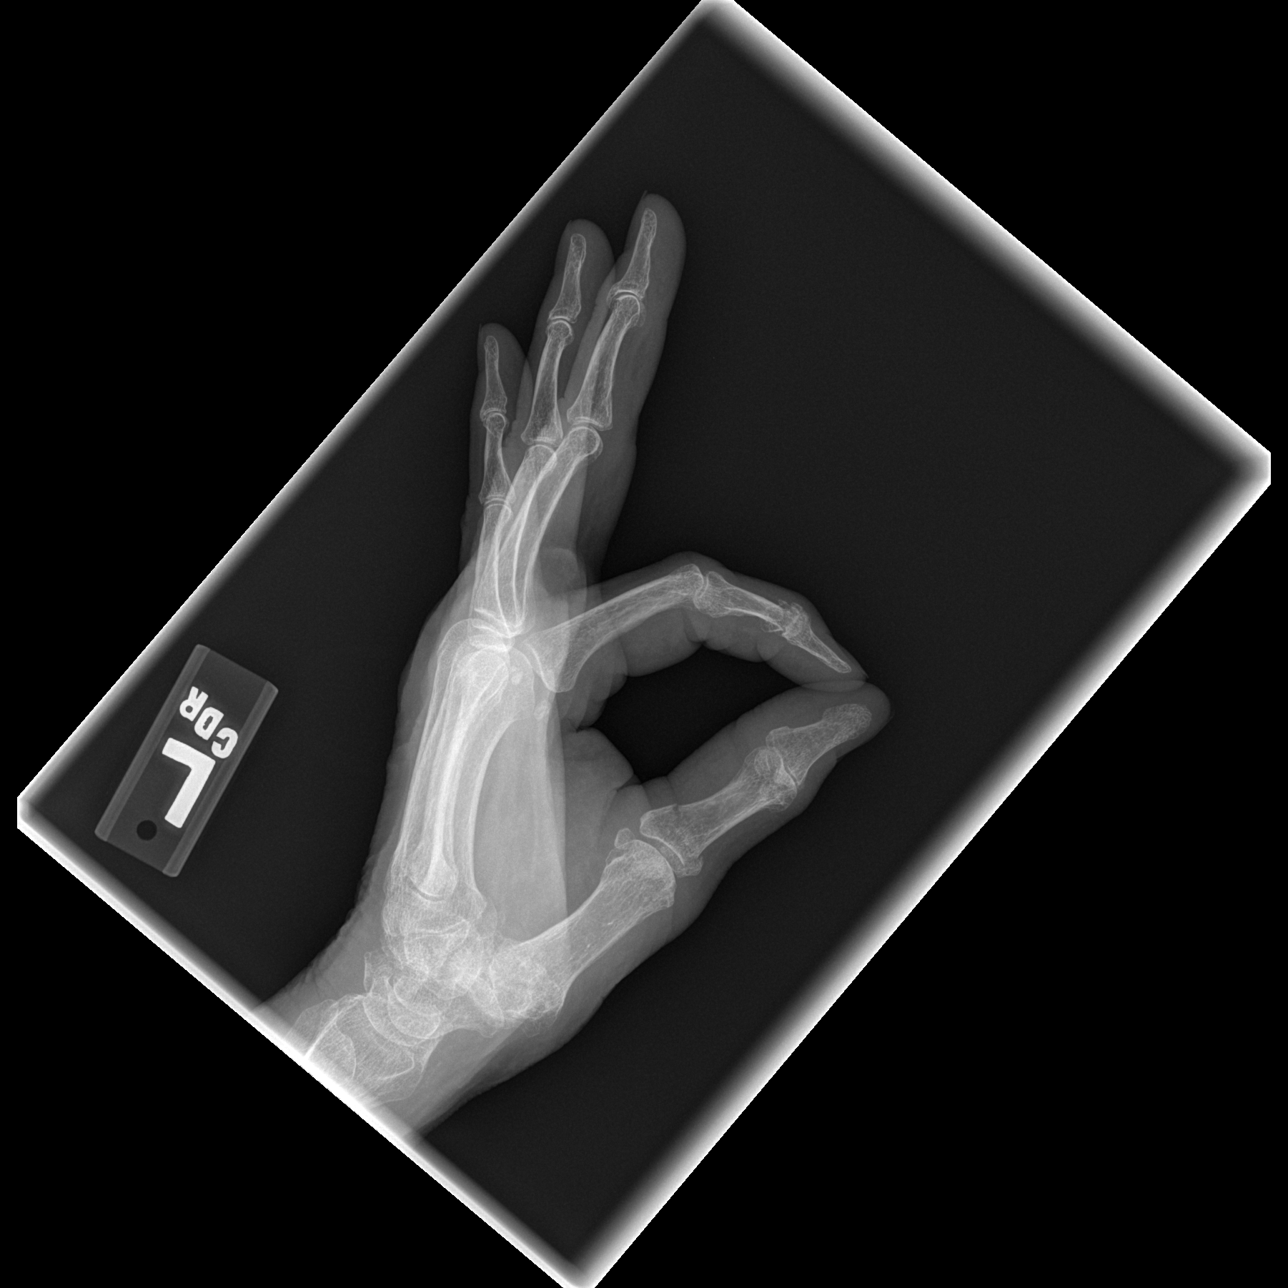

[3 of 3 positions shown; findings below may reference images not displayed]

FINDINGS: There is diffuse decreased bone mineralization. Degenerative changes
over the radiocarpal joint and carpal bones as well as significant
degenerative change of the first carpometacarpal joint. Mild
degenerate change of the interphalangeal joints. There is a
displaced fracture involving the base of the first proximal phalanx
likely with extension to the articular surface. Remainder of the
exam is unremarkable.
IMPRESSION: Displaced fracture involving the base of the first proximal phalanx
likely with extension to the articular surface.
